# Patient Record
Sex: Female | Born: 1985 | Race: White | Hispanic: No | Marital: Single | Smoking: Never smoker
Health system: Southern US, Community
[De-identification: ages and names within clinical notes are randomized; demographics above are authoritative.]

## PROBLEM LIST (undated history)

## (undated) DIAGNOSIS — K219 Gastro-esophageal reflux disease without esophagitis: Secondary | ICD-10-CM

## (undated) DIAGNOSIS — J45909 Unspecified asthma, uncomplicated: Secondary | ICD-10-CM

## (undated) DIAGNOSIS — Z7289 Other problems related to lifestyle: Secondary | ICD-10-CM

## (undated) DIAGNOSIS — F419 Anxiety disorder, unspecified: Secondary | ICD-10-CM

## (undated) DIAGNOSIS — R519 Headache, unspecified: Secondary | ICD-10-CM

## (undated) DIAGNOSIS — R51 Headache: Secondary | ICD-10-CM

## (undated) DIAGNOSIS — F32A Depression, unspecified: Secondary | ICD-10-CM

## (undated) DIAGNOSIS — F988 Other specified behavioral and emotional disorders with onset usually occurring in childhood and adolescence: Secondary | ICD-10-CM

## (undated) DIAGNOSIS — M419 Scoliosis, unspecified: Secondary | ICD-10-CM

## (undated) DIAGNOSIS — J309 Allergic rhinitis, unspecified: Secondary | ICD-10-CM

## (undated) DIAGNOSIS — F509 Eating disorder, unspecified: Secondary | ICD-10-CM

## (undated) DIAGNOSIS — F329 Major depressive disorder, single episode, unspecified: Secondary | ICD-10-CM

## (undated) HISTORY — DX: Other specified behavioral and emotional disorders with onset usually occurring in childhood and adolescence: F98.8

## (undated) HISTORY — DX: Gastro-esophageal reflux disease without esophagitis: K21.9

## (undated) HISTORY — DX: Headache, unspecified: R51.9

## (undated) HISTORY — DX: Unspecified asthma, uncomplicated: J45.909

## (undated) HISTORY — PX: WISDOM TOOTH EXTRACTION: SHX21

## (undated) HISTORY — DX: Eating disorder, unspecified: F50.9

## (undated) HISTORY — DX: Other problems related to lifestyle: Z72.89

## (undated) HISTORY — DX: Major depressive disorder, single episode, unspecified: F32.9

## (undated) HISTORY — DX: Depression, unspecified: F32.A

## (undated) HISTORY — DX: Allergic rhinitis, unspecified: J30.9

## (undated) HISTORY — DX: Scoliosis, unspecified: M41.9

## (undated) HISTORY — DX: Anxiety disorder, unspecified: F41.9

## (undated) HISTORY — DX: Headache: R51

---
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
## 2004-12-30 ENCOUNTER — Other Ambulatory Visit: Admission: RE | Admit: 2004-12-30 | Discharge: 2004-12-30 | Payer: Self-pay | Admitting: Gynecology

## 2005-05-24 ENCOUNTER — Ambulatory Visit: Payer: Self-pay | Admitting: Pulmonary Disease

## 2005-06-01 ENCOUNTER — Ambulatory Visit: Payer: Self-pay | Admitting: Pulmonary Disease

## 2006-01-01 ENCOUNTER — Other Ambulatory Visit: Admission: RE | Admit: 2006-01-01 | Discharge: 2006-01-01 | Payer: Self-pay | Admitting: Gynecology

## 2006-01-15 ENCOUNTER — Ambulatory Visit: Payer: Self-pay | Admitting: Internal Medicine

## 2006-04-24 ENCOUNTER — Ambulatory Visit: Payer: Self-pay | Admitting: Pulmonary Disease

## 2006-05-08 ENCOUNTER — Ambulatory Visit (HOSPITAL_COMMUNITY): Admission: RE | Admit: 2006-05-08 | Discharge: 2006-05-08 | Payer: Self-pay | Admitting: Pulmonary Disease

## 2006-06-05 ENCOUNTER — Encounter: Payer: Self-pay | Admitting: Cardiology

## 2006-06-05 ENCOUNTER — Ambulatory Visit: Payer: Self-pay

## 2007-02-18 ENCOUNTER — Other Ambulatory Visit: Admission: RE | Admit: 2007-02-18 | Discharge: 2007-02-18 | Payer: Self-pay | Admitting: Gynecology

## 2008-02-19 ENCOUNTER — Other Ambulatory Visit: Admission: RE | Admit: 2008-02-19 | Discharge: 2008-02-19 | Payer: Self-pay | Admitting: Gynecology

## 2008-02-20 ENCOUNTER — Emergency Department (HOSPITAL_COMMUNITY): Admission: EM | Admit: 2008-02-20 | Discharge: 2008-02-20 | Payer: Self-pay | Admitting: Family Medicine
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
## 2011-01-25 ENCOUNTER — Inpatient Hospital Stay (INDEPENDENT_AMBULATORY_CARE_PROVIDER_SITE_OTHER)
Admission: RE | Admit: 2011-01-25 | Discharge: 2011-01-25 | Disposition: A | Discharge status: Home / Self Care | Payer: 59 | Source: Ambulatory Visit | Attending: Family Medicine | Admitting: Family Medicine

## 2011-01-25 DIAGNOSIS — F411 Generalized anxiety disorder: Secondary | ICD-10-CM

## 2011-01-29 ENCOUNTER — Inpatient Hospital Stay (INDEPENDENT_AMBULATORY_CARE_PROVIDER_SITE_OTHER)
Admission: RE | Admit: 2011-01-29 | Discharge: 2011-01-29 | Disposition: A | Discharge status: Home / Self Care | Payer: 59 | Source: Ambulatory Visit | Attending: Emergency Medicine | Admitting: Emergency Medicine

## 2011-01-29 DIAGNOSIS — F411 Generalized anxiety disorder: Secondary | ICD-10-CM

## 2011-01-29 LAB — POCT PREGNANCY, URINE: Preg Test, Ur: NEGATIVE

## 2011-09-08 ENCOUNTER — Telehealth: Payer: Self-pay

## 2011-09-08 NOTE — Telephone Encounter (Signed)
.  [facility] PT WOULD LIKE TO KNOW IF WE WOULD CALL HER IN SOME BCP'S WITHOUT HER GETTING A PE OR COMING IN PL.EASE CALL [phone]  [facility] [address]

## 2011-09-09 NOTE — Telephone Encounter (Signed)
Left message on machine to call back  

## 2011-09-10 NOTE — Telephone Encounter (Signed)
LMOM THAT SHE NEEDS TO COME BACK FOR A PAP

## 2012-06-26 ENCOUNTER — Ambulatory Visit
Admission: RE | Admit: 2012-06-26 | Discharge: 2012-06-26 | Disposition: A | Discharge status: Home / Self Care | Payer: Worker's Compensation | Source: Ambulatory Visit | Attending: Family Medicine | Admitting: Family Medicine

## 2012-06-26 ENCOUNTER — Other Ambulatory Visit: Payer: Self-pay | Admitting: Family Medicine

## 2012-06-26 DIAGNOSIS — S39012A Strain of muscle, fascia and tendon of lower back, initial encounter: Secondary | ICD-10-CM

## 2012-10-10 ENCOUNTER — Encounter (HOSPITAL_COMMUNITY): Payer: Self-pay

## 2012-10-10 ENCOUNTER — Emergency Department (HOSPITAL_COMMUNITY)
Admission: EM | Admit: 2012-10-10 | Discharge: 2012-10-10 | Disposition: A | Discharge status: Home / Self Care | Payer: Self-pay | Source: Home / Self Care | Attending: Emergency Medicine | Admitting: Emergency Medicine

## 2012-10-10 DIAGNOSIS — R590 Localized enlarged lymph nodes: Secondary | ICD-10-CM

## 2012-10-10 LAB — CBC
HCT: 42.8 % (ref 36.0–46.0)
Hemoglobin: 14.5 g/dL (ref 12.0–15.0)
MCH: 33.6 pg (ref 26.0–34.0)
MCHC: 33.9 g/dL (ref 30.0–36.0)
RBC: 4.32 MIL/uL (ref 3.87–5.11)

## 2012-10-10 NOTE — ED Notes (Signed)
Patient here to establish care Needs physical and also has swollen lymph nodes in her neck Which appeared about 5 weeks ago
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
## 2012-10-15 ENCOUNTER — Ambulatory Visit (HOSPITAL_COMMUNITY): Admission: RE | Admit: 2012-10-15 | Payer: Self-pay | Source: Ambulatory Visit

## 2012-10-15 ENCOUNTER — Other Ambulatory Visit (HOSPITAL_COMMUNITY): Payer: Self-pay

## 2012-10-15 NOTE — ED Notes (Signed)
Appointment made at [facility] for ct of neck.message left on patients machine(2 different numbers) pt did not show for appt

## 2012-10-23 ENCOUNTER — Other Ambulatory Visit (HOSPITAL_COMMUNITY): Payer: Self-pay

## 2012-11-05 ENCOUNTER — Ambulatory Visit: Payer: Self-pay | Admitting: Physician Assistant

## 2012-11-05 ENCOUNTER — Encounter: Payer: Self-pay | Admitting: Physician Assistant

## 2012-11-05 VITALS — BP 99/64 | HR 62 | Temp 98.7°F | Resp 16 | Ht 64.0 in | Wt 105.0 lb

## 2012-11-05 DIAGNOSIS — F329 Major depressive disorder, single episode, unspecified: Secondary | ICD-10-CM | POA: Insufficient documentation

## 2012-11-05 DIAGNOSIS — R590 Localized enlarged lymph nodes: Secondary | ICD-10-CM

## 2012-11-05 DIAGNOSIS — R599 Enlarged lymph nodes, unspecified: Secondary | ICD-10-CM

## 2012-11-05 DIAGNOSIS — F341 Dysthymic disorder: Secondary | ICD-10-CM

## 2012-11-05 DIAGNOSIS — T7840XA Allergy, unspecified, initial encounter: Secondary | ICD-10-CM | POA: Insufficient documentation

## 2012-11-05 MED ORDER — AMOXICILLIN 875 MG PO TABS: 875.0000 mg | ORAL_TABLET | Freq: Two times a day (BID) | ORAL | Status: DC

## 2012-11-05 MED ORDER — FLUOXETINE HCL 40 MG PO CAPS: 40.0000 mg | ORAL_CAPSULE | Freq: Every day | ORAL | Status: DC

## 2012-11-05 NOTE — Patient Instructions (Signed)
Keep doping things that make you happy and feel content, that don't involve hurting yourself! Continue the Prozac at 40 mg for the next 2-4 weeks.  Then, contact me if you think you need to increase the dose to 60 mg. If the lymph nodes become larger, or begin to hurt, we need to re-assess. Once you have insurance, we'll do some additional testing.

## 2012-11-05 NOTE — Progress Notes (Signed)
Subjective:    Patient ID: Alexandra Martin, female    DOB: September 13, 1985, 27 y.o.   MRN: [id number]  HPI  This 27 y.o. female presents for evaluation of anxiety/depression.  She was last seen here in 12/2010, last by me in 09/2010.  In the interim, she hasn't received care anywhere.  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  She does have dental and vision coverage.  Will be able to sign up for it in 05/2013, but hopes to be in a better job by then.  Has been working this job since 02/2012.  Bulemia developed around that time, perhaps just before.  Worsened in 06/2012, and she stopped having periods. Pap was normal (recall she has a history of abnormal pap, s/p LEEP).  HIV negative 08/2012 at free screening at [facility]  Seen about 2 weeks ago at the [facility] for persistently swollen glands in the neck after an illness (sinus drainage).  Non-tender.  ST and drainage again x 1 week. Notes that the swollen glands began about the time that her binge-purge behavior escalated.  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  She thinks she shifted to bulemia instead.  Found a leftover bottle of Prozac and started it 2 weeks ago. She previously took 60 mg daily, but has started back at 40 mg to make her supply last longer. "I'm eating now.  And it's healthy."   "I even went home an brushed my hair."  Would like a refill.  Eats a gluten-free diet. Also avoids casein.   Review of Systems  Constitutional: Positive for appetite change. Negative for fever, chills, diaphoresis, activity change, fatigue and unexpected weight change.  HENT: Negative.   Eyes: Negative.   Respiratory: Negative.   Cardiovascular: Negative.   Gastrointestinal: Negative.   Endocrine: Negative.   Genitourinary: Negative.   Musculoskeletal: Positive for  back pain (recent back strain at work.  Seen by their [facility] provider and prescribed meloxicam and cyclobenzaprine.). Negative for myalgias, joint swelling, arthralgias and gait problem.  Skin: Negative.   Allergic/Immunologic: Negative.   Neurological: Negative.   Hematological: Positive for adenopathy (2 months of swollen glands in the neck.  CBC 2 weeks ago was normal.  Non-tender.  Not gowing in size.).  Psychiatric/Behavioral: Positive for sleep disturbance and dysphoric mood. Negative for suicidal ideas. Self-injury: no cutting in several months, but started binge-purge cycles. The patient is nervous/anxious.     Past Medical History  Diagnosis Date  . Depression   . Anxiety   . Asthma     childhood  . Allergic rhinitis, cause unspecified   . ADD (attention deficit disorder)   . HA (headache)     ? Migraine  . GERD (gastroesophageal reflux disease)   . Scoliosis   . Eating disorder     Bulemia  . Deliberate self-cutting     Past Surgical History  Procedure Laterality Date  . Wisdom tooth extraction      Prior to Admission medications   Medication Sig Start Date End Date Taking? Authorizing Provider  FLUoxetine (PROZAC) 40 MG capsule Take 1 capsule (40 mg total) by mouth daily. 11/05/12  Yes Jerriah Ines S Kesler Wickham, PA-C  GLUCOSAMINE HCL PO Take 2,000 mg by mouth daily.   Yes  Historical Provider, MD  meloxicam (MOBIC) 15 MG tablet Take 15 mg by mouth daily.   Yes Historical Provider, MD  Multiple Vitamins-Minerals (MULTIVITAMIN WITH MINERALS) tablet Take 1 tablet by mouth daily.   Yes Historical Provider, MD  traMADol (ULTRAM) 50 MG tablet Take 50 mg by mouth every 6 (six) hours as needed for pain.   Yes Historical Provider, MD  zinc gluconate 50 MG tablet Take 50 mg by mouth daily.   Yes Historical Provider, MD    Allergies  Allergen Reactions  . Abilify (Aripiprazole)     itching  . Adhesive (Tape)   . Codeine   . Dextromethorphan     Throat swelling  . Caine-1 (Lidocaine)  Rash    Local skin reaction    History   Social History  . Marital Status: Single    Spouse Name: n/a    Number of Children: 0  . Years of Education: 16   Occupational History  . specimen processor    Social History Main Topics  . Smoking status: Never Smoker   . Smokeless tobacco: Never Used  . Alcohol Use: 0 - 2.4 oz/week    0-4 Glasses of wine per week  . Drug Use: No  . Sexually Active: Not Currently -- Female partner(s)   Other Topics Concern  . Not on file   Social History Narrative   Lives with her mother.  Her father is remarried and lives in [place], [place].  Her brother is married (2nd marriage) and lives about an hour away.  Her sister loves in their grandmother's former house in [place].    Family History  Problem Relation Age of Onset  . Thyroid disease Mother     hypothyroidism  . Arthritis Mother   . Glaucoma Mother   . Scoliosis Father   . Thyroid disease Sister     hypothyroidism  . Asthma Sister   . Scoliosis Brother   . Asthma Brother   . Diabetes Maternal Grandmother   . Hypertension Maternal Grandmother   . Stroke Maternal Grandmother   . Stroke Maternal Grandfather        Objective:   Physical Exam Blood pressure 99/64, pulse 62, temperature 98.7 F (37.1 C), temperature source Oral, resp. rate 16, height 5\' 4"  (1.626 m), weight 105 lb (47.628 kg). Body mass index is 18.01 kg/(m^2). Well-developed, well nourished WF who is awake, alert and oriented, in NAD. HEENT: [place]/AT, PERRL, EOMI.  Sclera and conjunctiva are clear.  EAC are patent, TMs are normal in appearance. Nasal mucosa is pink and moist. OP is clear. Neck: supple, non-tender, no thyromegaly. Submandibular glands are swollen bilaterally.  Non-tender.  No other palpable nodes. Heart: RRR, no murmur Lungs: normal effort, CTA Extremities: no cyanosis, clubbing or edema. Skin: warm and dry without rash. Psychologic: good mood and appropriate affect, normal speech and behavior. NO  SI/HI.        Assessment & Plan:  Anxiety and depression - Plan: FLUoxetine (PROZAC) 40 MG capsule.  Continue this dose x 2-4 more weeks, then let me know if she thinks an increase would improve her mood, help prevent her binge-purge and thoughts of self-harm. Once she has health insurance again, she's encouraged to seek counseling again, though if her symptoms do not remain controlled, will encourage therapy sooner.  Lymphadenopathy, submandibular - Plan: amoxicillin (AMOXIL) 875 MG tablet.  If develops tenderness or enlargement, re-evaluate.  May need surgery evaluation.  Fernande Bras, PA-C Certified Physician Assistant [facility]

## 2012-11-08 ENCOUNTER — Encounter: Payer: Self-pay | Admitting: Physician Assistant

## 2012-11-08 DIAGNOSIS — F988 Other specified behavioral and emotional disorders with onset usually occurring in childhood and adolescence: Secondary | ICD-10-CM | POA: Insufficient documentation

## 2012-11-08 DIAGNOSIS — F329 Major depressive disorder, single episode, unspecified: Secondary | ICD-10-CM

## 2012-11-08 DIAGNOSIS — F419 Anxiety disorder, unspecified: Secondary | ICD-10-CM

## 2012-11-14 ENCOUNTER — Encounter: Payer: Self-pay | Admitting: Physician Assistant

## 2012-11-16 ENCOUNTER — Encounter: Payer: Self-pay | Admitting: Physician Assistant

## 2012-11-19 ENCOUNTER — Telehealth: Payer: Self-pay

## 2012-11-19 NOTE — Telephone Encounter (Signed)
Pt is calling to request a work note. States she had been communicating via my chart with chelle and pt was under the impression the note was ready.  However, per front office there is not a note ready for pick up. Please call pt to advise

## 2012-11-19 NOTE — Telephone Encounter (Signed)
Note at front desk. Called patient to advise.

## 2012-12-24 ENCOUNTER — Ambulatory Visit: Payer: Self-pay | Admitting: Physician Assistant

## 2012-12-24 VITALS — BP 118/60 | HR 74 | Temp 97.8°F | Resp 16 | Ht 64.5 in | Wt 104.0 lb

## 2012-12-24 DIAGNOSIS — T2220XA Burn of second degree of shoulder and upper limb, except wrist and hand, unspecified site, initial encounter: Secondary | ICD-10-CM

## 2012-12-24 DIAGNOSIS — F341 Dysthymic disorder: Secondary | ICD-10-CM

## 2012-12-24 DIAGNOSIS — F419 Anxiety disorder, unspecified: Secondary | ICD-10-CM

## 2012-12-24 MED ORDER — ALPRAZOLAM 0.25 MG PO TABS: 0.2500 mg | ORAL_TABLET | ORAL | Status: DC | PRN

## 2012-12-24 MED ORDER — SILVER SULFADIAZINE 1 % EX CREA: TOPICAL_CREAM | Freq: Two times a day (BID) | CUTANEOUS | Status: AC

## 2012-12-24 MED ORDER — FLUOXETINE HCL 20 MG PO TABS: 20.0000 mg | ORAL_TABLET | Freq: Every day | ORAL | Status: DC

## 2012-12-24 NOTE — Patient Instructions (Addendum)
Wash the wound and dress it 2 times daily, until healed.  Keep it covered at work. Reduce the Prozac from 40 mg daily to 20 mg daily. Use the alprazolam (Xanax) only as needed for panic. Stay away from alcohol. Consider attending AA or NA.
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
## 2012-12-25 ENCOUNTER — Encounter: Payer: Self-pay | Admitting: Physician Assistant

## 2013-01-03 ENCOUNTER — Encounter: Payer: Self-pay | Admitting: Physician Assistant

## 2013-01-03 NOTE — Telephone Encounter (Signed)
Any suggestions for scars, Mederma?

## 2013-02-12 ENCOUNTER — Encounter: Payer: Self-pay | Admitting: Physician Assistant

## 2013-02-17 ENCOUNTER — Encounter: Payer: Self-pay | Admitting: Physician Assistant

## 2013-02-18 ENCOUNTER — Encounter: Payer: Self-pay | Admitting: Physician Assistant

## 2013-02-27 ENCOUNTER — Encounter: Payer: Self-pay | Admitting: Physician Assistant

## 2013-03-11 ENCOUNTER — Encounter: Payer: Self-pay | Admitting: Physician Assistant

## 2013-03-11 ENCOUNTER — Telehealth: Payer: Self-pay | Admitting: Physician Assistant

## 2013-03-11 DIAGNOSIS — F419 Anxiety disorder, unspecified: Secondary | ICD-10-CM

## 2013-03-11 MED ORDER — FLUOXETINE HCL 20 MG PO TABS: 40.0000 mg | ORAL_TABLET | Freq: Every day | ORAL | Status: DC

## 2013-03-11 NOTE — Telephone Encounter (Signed)
Needs FMLA papers completed.  Also needs to increase Prozac dose from 20 mg to 40 mg (has been taking 2 20 mg tabs, and will run out early).  Meds ordered this encounter  Medications  . FLUoxetine (PROZAC) 20 MG tablet    Sig: Take 2 tablets (40 mg total) by mouth daily.    Dispense:  120 tablet    Refill:  3    Order Specific Question:  Supervising Provider    Answer:  DOOLITTLE, ROBERT P [[id number]]

## 2013-03-12 ENCOUNTER — Other Ambulatory Visit: Payer: Self-pay | Admitting: Physician Assistant

## 2013-03-12 MED ORDER — FLUOXETINE HCL 20 MG PO CAPS: 40.0000 mg | ORAL_CAPSULE | Freq: Every day | ORAL | Status: DC

## 2013-03-29 ENCOUNTER — Encounter: Payer: Self-pay | Admitting: Physician Assistant

## 2013-05-23 ENCOUNTER — Encounter: Payer: Self-pay | Admitting: Physician Assistant

## 2013-05-23 DIAGNOSIS — F329 Major depressive disorder, single episode, unspecified: Secondary | ICD-10-CM

## 2013-06-02 MED ORDER — CLONAZEPAM 0.5 MG PO TABS: 0.2500 mg | ORAL_TABLET | Freq: Two times a day (BID) | ORAL | Status: DC | PRN

## 2013-06-02 NOTE — Telephone Encounter (Signed)
Please phone this in:  Meds ordered this encounter  Medications  . clonazePAM (KLONOPIN) 0.5 MG tablet    Sig: Take 0.5-1 tablets (0.25-0.5 mg total) by mouth 2 (two) times daily as needed for anxiety.    Dispense:  60 tablet    Refill:  0    Order Specific Question:  Supervising Provider    Answer:  Tonye Pearson [[id number]]   Patient aware by My Chart.

## 2013-06-03 NOTE — Telephone Encounter (Signed)
Pt called in to check status and discovered this message had not routed. I advised pt that I would call Rx in and did so. Per her req, checked cost for tab vs caps and was told that only comes in tablets. Spoke w/Chelle concerning message not routing properly so that we could see the message to call in Rx.

## 2013-06-05 ENCOUNTER — Other Ambulatory Visit: Payer: Self-pay

## 2013-06-17 MED ORDER — ALPRAZOLAM 0.25 MG PO TABS: 0.2500 mg | ORAL_TABLET | ORAL | Status: DC | PRN

## 2013-06-17 NOTE — Telephone Encounter (Signed)
Please phone in the alprazolam per below.  We're discontinuing the clonazepam and going back to the alprazolam.  The patient is aware by MyChart.  Meds ordered this encounter  Medications  . DISCONTD: clonazePAM (KLONOPIN) 0.5 MG tablet    Sig: Take 0.5-1 tablets (0.25-0.5 mg total) by mouth 2 (two) times daily as needed for anxiety.    Dispense:  60 tablet    Refill:  0    Order Specific Question:  Supervising Provider    Answer:  DOOLITTLE, ROBERT P [[id number]]  . ALPRAZolam (XANAX) 0.25 MG tablet    Sig: Take 1 tablet (0.25 mg total) by mouth as needed for anxiety.    Dispense:  30 tablet    Refill:  0    Order Specific Question:  Supervising Provider    Answer:  DOOLITTLE, ROBERT P [[id number]]

## 2013-06-17 NOTE — Addendum Note (Signed)
Addended by: Fernande Bras on: 06/17/2013 02:05 PM   Modules accepted: Orders, Medications

## 2013-07-01 DIAGNOSIS — Z0271 Encounter for disability determination: Secondary | ICD-10-CM

## 2013-07-07 ENCOUNTER — Other Ambulatory Visit: Payer: Self-pay | Admitting: Physician Assistant

## 2013-07-08 ENCOUNTER — Other Ambulatory Visit: Payer: Self-pay

## 2013-07-16 ENCOUNTER — Other Ambulatory Visit: Payer: Self-pay | Admitting: Physician Assistant

## 2013-07-16 MED ORDER — ALPRAZOLAM 0.5 MG PO TABS: 0.5000 mg | ORAL_TABLET | Freq: Every day | ORAL | Status: DC | PRN

## 2013-07-16 NOTE — Telephone Encounter (Signed)
Meds ordered this encounter  Medications  . DISCONTD: clonazePAM (KLONOPIN) 0.5 MG tablet    Sig: Take 0.5-1 tablets (0.25-0.5 mg total) by mouth 2 (two) times daily as needed for anxiety.    Dispense:  60 tablet    Refill:  0    Order Specific Question:  Supervising Provider    Answer:  DOOLITTLE, ROBERT P [[id number]]  . DISCONTD: ALPRAZolam (XANAX) 0.25 MG tablet    Sig: Take 1 tablet (0.25 mg total) by mouth as needed for anxiety.    Dispense:  30 tablet    Refill:  0    Order Specific Question:  Supervising Provider    Answer:  DOOLITTLE, ROBERT P [[id number]]  . ALPRAZolam (XANAX) 0.5 MG tablet    Sig: Take 1-1.5 tablets (0.5-0.75 mg total) by mouth daily as needed for anxiety.    Dispense:  45 tablet    Refill:  0    Order Specific Question:  Supervising Provider    Answer:  DOOLITTLE, ROBERT P [[id number]]

## 2013-07-16 NOTE — Addendum Note (Signed)
Addended by: Fernande Bras on: 07/16/2013 08:52 PM   Modules accepted: Orders, Medications

## 2013-07-17 NOTE — Telephone Encounter (Signed)
faxed

## 2013-08-12 ENCOUNTER — Encounter: Payer: Self-pay | Admitting: Physician Assistant

## 2013-08-13 MED ORDER — BENZONATATE 100 MG PO CAPS: 100.0000 mg | ORAL_CAPSULE | Freq: Three times a day (TID) | ORAL | Status: AC | PRN

## 2013-08-13 MED ORDER — ALBUTEROL SULFATE HFA 108 (90 BASE) MCG/ACT IN AERS: 2.0000 | INHALATION_SPRAY | RESPIRATORY_TRACT | Status: AC | PRN

## 2013-08-14 ENCOUNTER — Telehealth: Payer: Self-pay | Admitting: Physician Assistant

## 2013-08-14 NOTE — Telephone Encounter (Signed)
error 

## 2013-08-19 NOTE — Telephone Encounter (Signed)
Patient will come in before these run out:  Meds ordered this encounter  Medications  . clonazePAM (KLONOPIN) 0.5 MG tablet    Sig: TAKE ONE-HALF TO ONE TABLET BY MOUTH TWICE DAILY AS NEEDED FOR ANXIETY    Dispense:  60 tablet    Refill:  0  . ALPRAZolam (XANAX) 0.5 MG tablet    Sig: TAKE ONE TO ONE & ONE-HALF TABLETS BY MOUTH ONCE DAILY AS NEEDED FOR ANXIETY    Dispense:  45 tablet    Refill:  0

## 2013-08-20 NOTE — Telephone Encounter (Signed)
Faxed to [facility] 

## 2013-09-01 MED ORDER — FLUOXETINE HCL 20 MG PO CAPS: 40.0000 mg | ORAL_CAPSULE | Freq: Every day | ORAL | Status: AC

## 2013-09-01 NOTE — Addendum Note (Signed)
Addended by: Fernande BrasJEFFERY, Rembert Browe S on: 09/01/2013 02:26 PM   Modules accepted: Orders

## 2013-09-01 NOTE — Telephone Encounter (Signed)
Meds ordered this encounter  Medications  . FLUoxetine (PROZAC) 20 MG capsule    Sig: Take 2 capsules (40 mg total) by mouth daily.    Dispense:  180 capsule    Refill:  3    Order Specific Question:  Supervising Provider    Answer:  Tonye PearsonOLITTLE, ROBERT P [[id number]]   Patient notified by My Chart. Also given information for [facility] Financial Aid Office.

## 2015-04-29 ENCOUNTER — Telehealth: Payer: Self-pay | Admitting: Physician Assistant

## 2015-04-29 NOTE — Telephone Encounter (Signed)
The following email was submitted to your website from [employer] [employer] TO:   Porfirio Oar,  I am in need of your advice and assistance regarding a new patient I am referring to you. I would greatly appreciate if you could talk with her or me about her needs. In short, her insurance is not widely accepted in town, and she is consequently lacking in the medical care she needs; and she is younger, so she doesn't know all of her options for receiving care. I am asking you, because I believe you honestly care about your patients and doing your job well, and I know you are knowledgeable. If I do not hear from you in a few days, I will call the office and leave another message.   Best wishes for you and yours, Alexandra Martin   The friend will see her pediatrician, who has agreed to see her, even though she is almost 19. Recommended her friend consider the [facility] and Wellness Center and/or [facility] Financial Aid. I will send contact numbers to her via My Chart.

## 2016-08-08 ENCOUNTER — Telehealth: Payer: Self-pay

## 2016-08-08 NOTE — Telephone Encounter (Signed)
THIS MESSAGE IS FROM PATIENT'S MOTHER (RHONDA Byrum) TO GO TO CHELLE: SHE STATES SHE WOULD REALLY LIKE CHELLE TO CALL HER DAUGHTER TO TALK ABOUT ACNE. CHELLE SAW HER ABOUT 3 YEARS AGO AND SHE SAID Rielly HAD GOOD COMMUNICATION WITH CHELLE. SHE LIVES OUT OF STATE AND SHE TRIED TO MY-CHART CHELLE BUT SHE WAS NOT ABLE TO.  BEST PHONE [phone] (CELL - SHE WANTED CHELLE TO TEXT HER IF POSSIBLE) HER MOTHER SAID IF CHELLE CAN NOT REACH HER TO CALL MOM AT [phone] - MOM IS RHONDA Braaten.  MBC

## 2016-08-09 ENCOUNTER — Encounter: Payer: Self-pay | Admitting: Physician Assistant

## 2016-08-11 NOTE — Telephone Encounter (Signed)
I have reached out to patient in My Chart, and she is able to contact me that way now. See messages there.
